# Patient Record
Sex: Male | Born: 1985 | Race: Black or African American | Hispanic: No | Marital: Single | Smoking: Light tobacco smoker
Health system: Southern US, Community
[De-identification: ages and names within clinical notes are randomized; demographics above are authoritative.]

## PROBLEM LIST (undated history)

## (undated) DIAGNOSIS — T7840XA Allergy, unspecified, initial encounter: Secondary | ICD-10-CM

## (undated) HISTORY — DX: Allergy, unspecified, initial encounter: T78.40XA

---
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
## 2005-04-30 ENCOUNTER — Emergency Department (HOSPITAL_COMMUNITY): Admission: EM | Admit: 2005-04-30 | Discharge: 2005-04-30 | Payer: Self-pay | Admitting: Emergency Medicine

## 2008-12-13 ENCOUNTER — Emergency Department (HOSPITAL_COMMUNITY): Admission: EM | Admit: 2008-12-13 | Discharge: 2008-12-13 | Payer: Self-pay | Admitting: Emergency Medicine
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
## 2013-09-29 ENCOUNTER — Ambulatory Visit (INDEPENDENT_AMBULATORY_CARE_PROVIDER_SITE_OTHER): Payer: 59 | Admitting: Family Medicine

## 2013-09-29 VITALS — BP 118/74 | HR 61 | Temp 98.1°F | Resp 16 | Ht 65.75 in | Wt 240.0 lb

## 2013-09-29 DIAGNOSIS — R21 Rash and other nonspecific skin eruption: Secondary | ICD-10-CM

## 2013-09-29 DIAGNOSIS — L282 Other prurigo: Secondary | ICD-10-CM

## 2013-09-29 DIAGNOSIS — L500 Allergic urticaria: Secondary | ICD-10-CM

## 2013-09-29 MED ORDER — TRIAMCINOLONE ACETONIDE 0.1 % EX CREA: 1.0000 "application " | TOPICAL_CREAM | Freq: Two times a day (BID) | CUTANEOUS | Status: AC

## 2013-09-29 MED ORDER — RANITIDINE HCL 150 MG PO TABS: 150.0000 mg | ORAL_TABLET | Freq: Two times a day (BID) | ORAL | Status: AC

## 2013-09-29 MED ORDER — HYDROXYZINE HCL 25 MG PO TABS: 12.5000 mg | ORAL_TABLET | Freq: Three times a day (TID) | ORAL | Status: AC | PRN

## 2013-09-29 MED ORDER — CETIRIZINE HCL 10 MG PO TABS: 10.0000 mg | ORAL_TABLET | Freq: Every day | ORAL | Status: AC

## 2013-09-29 MED ORDER — METHYLPREDNISOLONE ACETATE 80 MG/ML IJ SUSP
80.0000 mg | Freq: Once | INTRAMUSCULAR | Status: AC
  Administered 2013-09-29: 80 mg via INTRAMUSCULAR

## 2013-09-29 NOTE — Patient Instructions (Signed)
Start taking the cetirizine every evening and the ranitidine twice a day - both of these are to prevent allergic reactions.  You can take the hydroxyzine as needed for itching. Apply the topical lotion as needed for itching as well - keep it in the freezer and apply ice to the skin as needed for itching too.  Come back immediately if worsening at all or not going away after several days.  Hives Hives are itchy, red, swollen areas of the skin. They can vary in size and location on your body. Hives can come and go for hours or several days (acute hives) or for several weeks (chronic hives). Hives do not spread from person to person (noncontagious). They may get worse with scratching, exercise, and emotional stress. CAUSES   Allergic reaction to food, additives, or drugs.  Infections, including the common cold.  Illness, such as vasculitis, lupus, or thyroid disease.  Exposure to sunlight, heat, or cold.  Exercise.  Stress.  Contact with chemicals. SYMPTOMS   Red or white swollen patches on the skin. The patches may change size, shape, and location quickly and repeatedly.  Itching.  Swelling of the hands, feet, and face. This may occur if hives develop deeper in the skin. DIAGNOSIS  Your caregiver can usually tell what is wrong by performing a physical exam. Skin or blood tests may also be done to determine the cause of your hives. In some cases, the cause cannot be determined. TREATMENT  Mild cases usually get better with medicines such as antihistamines. Severe cases may require an emergency epinephrine injection. If the cause of your hives is known, treatment includes avoiding that trigger.  HOME CARE INSTRUCTIONS   Avoid causes that trigger your hives.  Take antihistamines as directed by your caregiver to reduce the severity of your hives. Non-sedating or low-sedating antihistamines are usually recommended. Do not drive while taking an antihistamine.  Take any other medicines  prescribed for itching as directed by your caregiver.  Wear loose-fitting clothing.  Keep all follow-up appointments as directed by your caregiver. SEEK MEDICAL CARE IF:   You have persistent or severe itching that is not relieved with medicine.  You have painful or swollen joints. SEEK IMMEDIATE MEDICAL CARE IF:   You have a fever.  Your tongue or lips are swollen.  You have trouble breathing or swallowing.  You feel tightness in the throat or chest.  You have abdominal pain. These problems may be the first sign of a life-threatening allergic reaction. Call your local emergency services (911 in [place] MAKE SURE YOU:   Understand these instructions.  Will watch your condition.  Will get help right away if you are not doing well or get worse. Document Released: 05/03/2005 Document Revised: 11/02/2011 Document Reviewed: 07/27/2011 [facility] Patient Information 2014 [place], [place].

## 2013-09-29 NOTE — Progress Notes (Signed)
   Subjective:    Patient ID: Roy Roth, male    DOB: [birth year], 28 y.o.   MRN: [id number] Chief Complaint  Patient presents with  . Rash    pruritic rash arms, chest, back, legs- started yesterday morning     HPI This chart was scribed for Sherren MochaEva N Bonner Larue, MD by Charline BillsEssence Howell, ED Scribe. The patient was seen in room 1. Patient's care was started at 10:28 AM.  HPI Comments: Roy BentCorey L Roth is a 28 y.o. male who presents to the [facility] complaining of itchy rash onset 3 days ago upon waking. Pt reports rash that began on L arm and has spread to his chest, stomach and bilateral flank. He denies rash to groin, fingers or toes. He also denies fever, chills, pain, swelling. Pt has not taken any new supplements, vitamins or changed detergents. He has applied cortisone cream twice daily since Thursday with no relief. No known allergies. No sick contacts.   Past Medical History  Diagnosis Date  . Allergy    No current outpatient prescriptions on file prior to visit.   No current facility-administered medications on file prior to visit.   No Known Allergies   Review of Systems  Constitutional: Negative for fever and chills.  Skin: Positive for rash.      Objective:  BP 118/74  Pulse 61  Temp(Src) 98.1 F (36.7 C) (Oral)  Resp 16  Ht 5' 5.75" (1.67 m)  Wt 240 lb (108.863 kg)  BMI 39.03 kg/m2  SpO2 98%  Physical Exam  Nursing note and vitals reviewed. Constitutional: He appears well-developed and well-nourished. No distress.  HENT:  Head: Normocephalic and atraumatic.  Neck: Neck supple.  Pulmonary/Chest: Effort normal.  Neurological: He is alert.  Skin: Rash noted. He is not diaphoretic.  Fine skin colored papules spread diffusely over bilateral upper extremities, coalescing towards wrist and spreading over chest, abdomen and around flank bilaterally No warmth, swelling, drainage       Assessment & Plan:   Pruritic rash - Plan: methylPREDNISolone  acetate (DEPO-MEDROL) injection 80 mg  Papular rash  Allergic urticaria  Meds ordered this encounter  Medications  . triamcinolone cream (KENALOG) 0.1 %    Sig: Apply 1 application topically 2 (two) times daily. Mix in a 1:1 ratio with Eucerin    Dispense:  454 g    Refill:  0  . cetirizine (ZYRTEC) 10 MG tablet    Sig: Take 1 tablet (10 mg total) by mouth at bedtime.    Dispense:  30 tablet    Refill:  1  . ranitidine (ZANTAC) 150 MG tablet    Sig: Take 1 tablet (150 mg total) by mouth 2 (two) times daily.    Dispense:  60 tablet    Refill:  1  . hydrOXYzine (ATARAX/VISTARIL) 25 MG tablet    Sig: Take 0.5-1 tablets (12.5-25 mg total) by mouth every 8 (eight) hours as needed for itching.    Dispense:  30 tablet    Refill:  0  . methylPREDNISolone acetate (DEPO-MEDROL) injection 80 mg    Sig:     I personally performed the services described in this documentation, which was scribed in my presence. The recorded information has been reviewed and considered, and addended by me as needed.  Norberto SorensonEva Okie Jansson, MD MPH
